# Patient Record
Sex: Male | Born: 1955 | Marital: Married
Health system: Southern US, Community
[De-identification: ages and names within clinical notes are randomized; demographics above are authoritative.]

---

## 2016-06-03 ENCOUNTER — Emergency Department (HOSPITAL_BASED_OUTPATIENT_CLINIC_OR_DEPARTMENT_OTHER): Payer: Self-pay

## 2016-06-03 ENCOUNTER — Encounter (HOSPITAL_BASED_OUTPATIENT_CLINIC_OR_DEPARTMENT_OTHER): Payer: Self-pay | Admitting: Emergency Medicine

## 2016-06-03 ENCOUNTER — Emergency Department (HOSPITAL_BASED_OUTPATIENT_CLINIC_OR_DEPARTMENT_OTHER)
Admission: EM | Admit: 2016-06-03 | Discharge: 2016-06-03 | Disposition: A | Discharge status: Home / Self Care | Payer: Self-pay | Attending: Emergency Medicine | Admitting: Emergency Medicine

## 2016-06-03 DIAGNOSIS — Y999 Unspecified external cause status: Secondary | ICD-10-CM | POA: Insufficient documentation

## 2016-06-03 DIAGNOSIS — T148 Other injury of unspecified body region: Secondary | ICD-10-CM | POA: Insufficient documentation

## 2016-06-03 DIAGNOSIS — S2241XA Multiple fractures of ribs, right side, initial encounter for closed fracture: Secondary | ICD-10-CM | POA: Insufficient documentation

## 2016-06-03 DIAGNOSIS — T148XXA Other injury of unspecified body region, initial encounter: Secondary | ICD-10-CM

## 2016-06-03 DIAGNOSIS — W19XXXA Unspecified fall, initial encounter: Secondary | ICD-10-CM | POA: Insufficient documentation

## 2016-06-03 DIAGNOSIS — Y929 Unspecified place or not applicable: Secondary | ICD-10-CM | POA: Insufficient documentation

## 2016-06-03 DIAGNOSIS — Y939 Activity, unspecified: Secondary | ICD-10-CM | POA: Insufficient documentation

## 2016-06-03 DIAGNOSIS — S2231XA Fracture of one rib, right side, initial encounter for closed fracture: Secondary | ICD-10-CM

## 2016-06-03 MED ORDER — NAPROXEN 250 MG PO TABS
500.0000 mg | ORAL_TABLET | Freq: Once | ORAL | Status: AC
  Administered 2016-06-03: 500 mg via ORAL

## 2016-06-03 MED FILL — Naproxen Tab 250 MG: ORAL | Qty: 2 | Status: AC

## 2016-06-03 NOTE — ED Notes (Signed)
Pt admitted and discharged during system downtime. See paper chart

## 2016-06-03 NOTE — ED Provider Notes (Signed)
CSN: [id number]     Arrival date & time 06/03/16  0410 History   None    No chief complaint on file.    (Consider location/radiation/quality/duration/timing/severity/associated sxs/prior Treatment) Patient is a 59 y.o. male presenting with fall. The history is provided by the patient.  Fall This is a new problem. The current episode started more than 2 days ago. The problem occurs constantly. The problem has not changed since onset.Pertinent negatives include no chest pain, no abdominal pain, no headaches and no shortness of breath. Nothing aggravates the symptoms. Nothing relieves the symptoms. He has tried nothing for the symptoms. The treatment provided no relief.  hit right ribs  History reviewed. No pertinent past medical history. No past surgical history on file. History reviewed. No pertinent family history. Social History  Substance Use Topics  . Smoking status: None  . Smokeless tobacco: None  . Alcohol Use: None    Review of Systems  Constitutional: Negative for fever.  Eyes: Negative for photophobia.  Respiratory: Negative for shortness of breath.   Cardiovascular: Negative for chest pain and leg swelling.  Gastrointestinal: Negative for abdominal pain.  Neurological: Negative for syncope and headaches.  All other systems reviewed and are negative.     Allergies  Review of patient's allergies indicates not on file.  Home Medications   Prior to Admission medications   Not on File   There were no vitals taken for this visit. Physical Exam  Constitutional: He is oriented to person, place, and time. He appears well-developed and well-nourished. No distress.  HENT:  Head: Normocephalic and atraumatic. Head is without raccoon's eyes and without Battle's sign.  Mouth/Throat: Oropharynx is clear and moist.  Eyes: Conjunctivae are normal. Pupils are equal, round, and reactive to light.  Neck: Normal range of motion. Neck supple.  Cardiovascular: Normal rate,  regular rhythm and intact distal pulses.   Pulmonary/Chest: Breath sounds normal. No respiratory distress. He has no wheezes. He has no rales. He exhibits no tenderness.  Abdominal: Soft. Bowel sounds are normal. There is no tenderness. There is no rebound and no guarding.  Musculoskeletal: Normal range of motion.  Neurological: He is alert and oriented to person, place, and time. He has normal reflexes.  Skin: Skin is warm and dry.  Psychiatric: He has a normal mood and affect.    ED Course  Procedures (including critical care time) Labs Review Labs Reviewed - No data to display  Imaging Review Dg Ribs Unilateral W/chest Right  06/03/2016  CLINICAL DATA:  Pain following fall 1 day prior EXAM: RIGHT RIBS AND CHEST - 3+ VIEW COMPARISON:  None. FINDINGS: Frontal chest as well as oblique and cone-down lower rib images were obtained. There are fractures of the posterolateral right eighth and ninth ribs with alignment at the fractures near anatomic. No pneumothorax or effusion. Lungs are clear. Heart size and pulmonary vascular normal. No adenopathy. There is degenerative change in the thoracic spine. IMPRESSION: Posterolateral right eighth and ninth rib fractures. No pneumothorax. No edema or consolidation. Electronically Signed   By: Bretta Bang III M.D.   On: 06/03/2016 10:18   I have personally reviewed and evaluated these images and lab results as part of my medical decision-making.   EKG Interpretation None      MDM   Final diagnoses:  Contusion    There were no vitals filed for this visit. No results found for this or any previous visit. Dg Ribs Unilateral W/chest Right  06/03/2016  CLINICAL DATA:  Pain following fall 1 day prior EXAM: RIGHT RIBS AND CHEST - 3+ VIEW COMPARISON:  None. FINDINGS: Frontal chest as well as oblique and cone-down lower rib images were obtained. There are fractures of the posterolateral right eighth and ninth ribs with alignment at the fractures  near anatomic. No pneumothorax or effusion. Lungs are clear. Heart size and pulmonary vascular normal. No adenopathy. There is degenerative change in the thoracic spine. IMPRESSION: Posterolateral right eighth and ninth rib fractures. No pneumothorax. No edema or consolidation. Electronically Signed   By: Bretta BangWilliam  Woodruff III M.D.   On: 06/03/2016 10:18    NSAIDs ice and incentive spirometer.  Spirometer provided.  Strict return precautions given    Jerzy Roepke, MD 06/03/16 2305

## 2017-05-01 IMAGING — DX DG RIBS W/ CHEST 3+V*R*
4 series · 5 of 5 positions shown · non-contrast
Comparison: None.

CLINICAL DATA: Pain following fall 1 day prior

EXAM:
RIGHT RIBS AND CHEST - 3+ VIEW

[chest ap (1 of 4)]
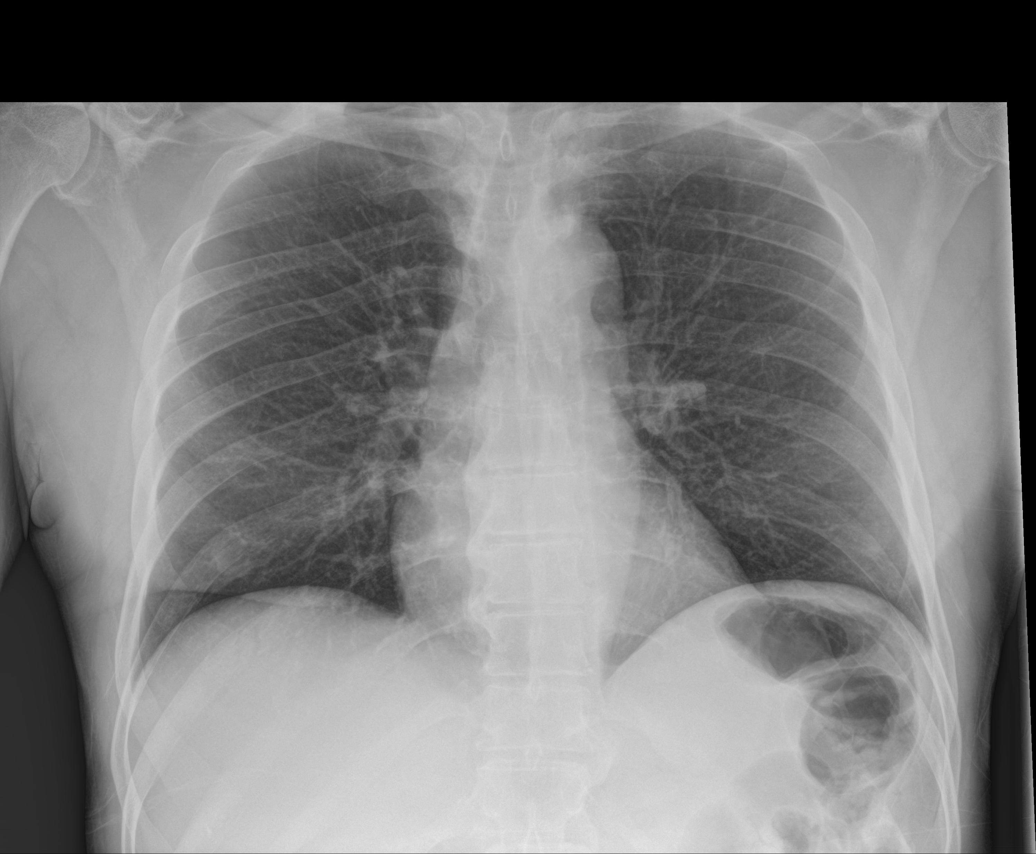

[chest ap (2 of 4)]
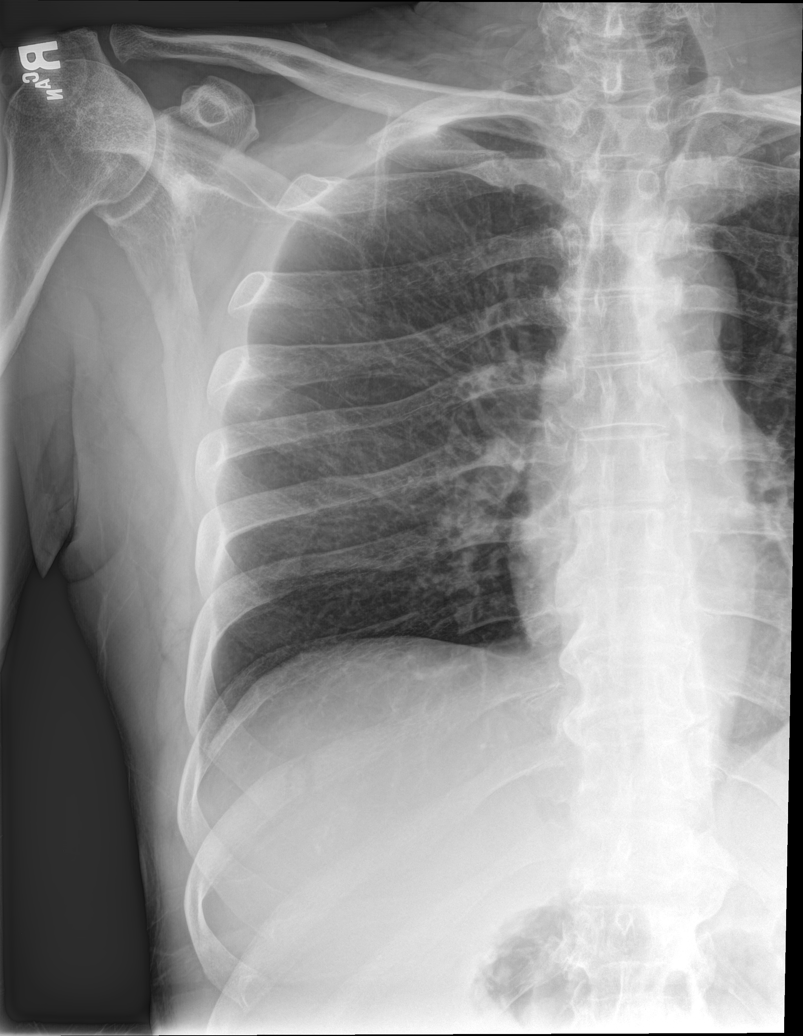

[chest ap (3 of 4)]
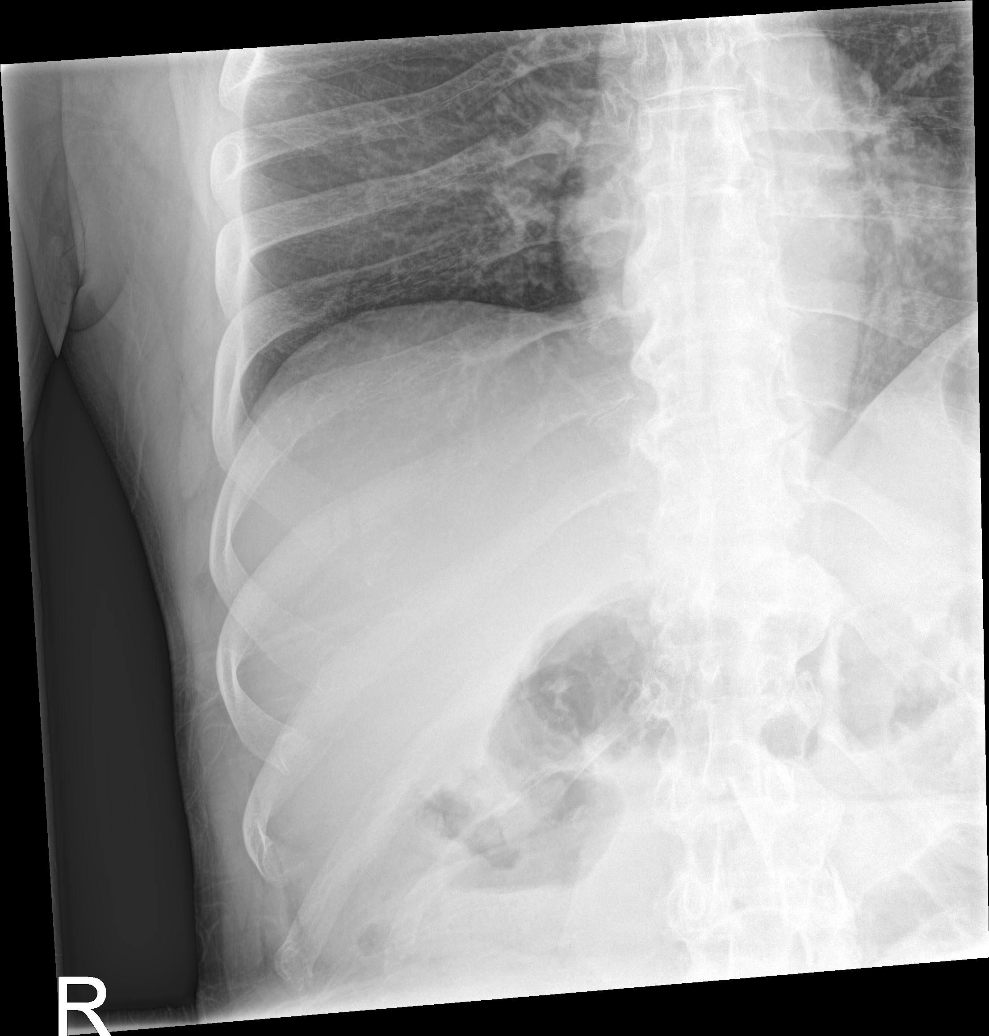

[Series 4: chest ap · 0.14mm/px · 2 of 2 slices shown (4 of 4)]
[im 1/2]
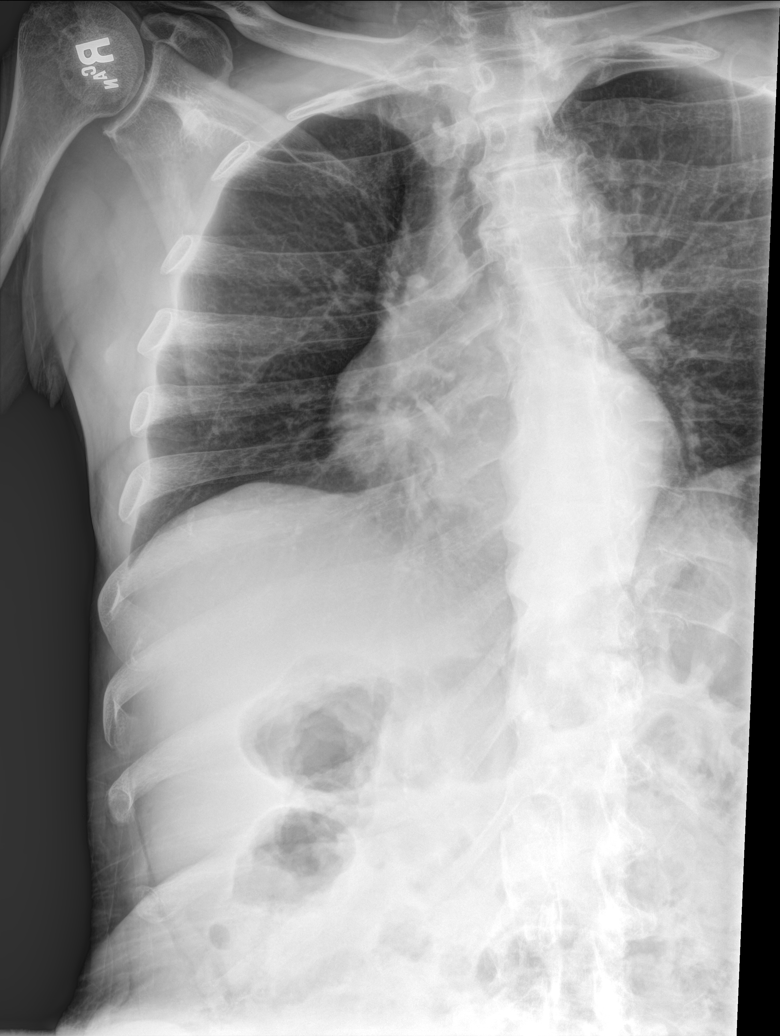
[im 2/2]
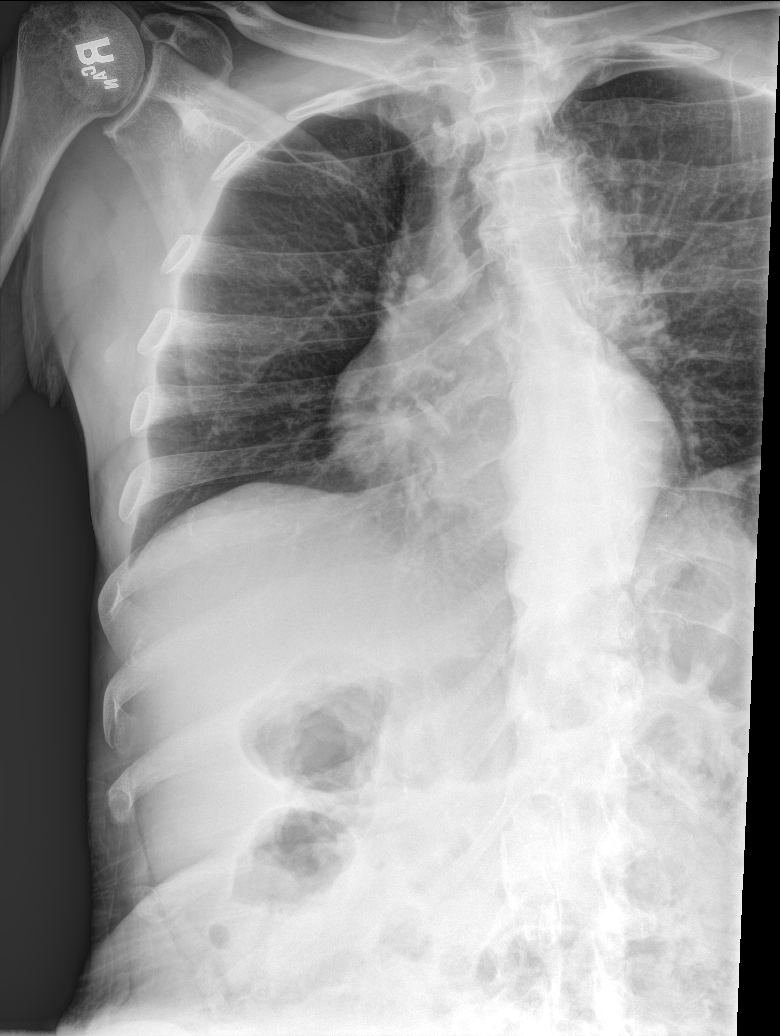

[5 of 5 positions shown; findings below may reference images not displayed]

FINDINGS: Frontal chest as well as oblique and cone-down lower rib images were
obtained. There are fractures of the posterolateral right eighth and
ninth ribs with alignment at the fractures near anatomic. No
pneumothorax or effusion. Lungs are clear. Heart size and pulmonary
vascular normal. No adenopathy. There is degenerative change in the
thoracic spine.
IMPRESSION: Posterolateral right eighth and ninth rib fractures. No
pneumothorax. No edema or consolidation.

## 2018-08-25 DEATH — deceased
